# Patient Record
Sex: Male | Born: 1955 | Race: White | Hispanic: No | Marital: Married | Smoking: Current every day smoker
Health system: Southern US, Community
[De-identification: ages and names within clinical notes are randomized; demographics above are authoritative.]

## PROBLEM LIST (undated history)

## (undated) ENCOUNTER — Emergency Department (HOSPITAL_COMMUNITY): Admission: EM | Payer: Self-pay

## (undated) HISTORY — PX: ANKLE SURGERY: SHX546

## (undated) HISTORY — PX: LEG SURGERY: SHX1003

## (undated) HISTORY — PX: VASECTOMY: SHX75

---

## 2011-02-28 ENCOUNTER — Other Ambulatory Visit: Payer: Self-pay

## 2011-02-28 ENCOUNTER — Emergency Department (HOSPITAL_COMMUNITY)
Admission: EM | Admit: 2011-02-28 | Discharge: 2011-02-28 | Disposition: A | Discharge status: Home / Self Care | Payer: Self-pay | Attending: Emergency Medicine | Admitting: Emergency Medicine

## 2011-02-28 ENCOUNTER — Encounter: Payer: Self-pay | Admitting: *Deleted

## 2011-02-28 DIAGNOSIS — R42 Dizziness and giddiness: Secondary | ICD-10-CM | POA: Insufficient documentation

## 2011-02-28 DIAGNOSIS — F17200 Nicotine dependence, unspecified, uncomplicated: Secondary | ICD-10-CM | POA: Insufficient documentation

## 2011-02-28 MED ORDER — MECLIZINE HCL 25 MG PO TABS
50.0000 mg | ORAL_TABLET | Freq: Once | ORAL | Status: AC
  Administered 2011-02-28: 50 mg via ORAL
  Filled 2011-02-28 (×2): qty 1

## 2011-02-28 MED ORDER — MECLIZINE HCL 25 MG PO TABS: 25.0000 mg | ORAL_TABLET | Freq: Three times a day (TID) | ORAL | Status: AC | PRN

## 2011-02-28 NOTE — ED Provider Notes (Signed)
History     CSN: [id number] Arrival date & time: 02/28/2011  4:23 PM   First MD Initiated Contact with Patient 02/28/11 1650      Chief Complaint  Patient presents with  . Dizziness     The history is provided by the patient and the spouse.  Onset - 4 days ago Duration - intermittent Worsened by - moving to the side in bed Improved by -sitting still Associated symptoms - nausea, vomiting  Pt reports episodes of dizziness - he reports surrounding spinning.  No syncope No cp/sob//abd pain/HA/focal weakness No slurred speech.  No difficulty swallowing No fever No hearing loss/change No visual change No falls/head trauma He takes no meds He has never had this before He vomited two days, none since   PMH - none  Past Surgical History  Procedure Date  . Ankle surgery   . Leg surgery   . Vasectomy     No family history on file.  History  Substance Use Topics  . Smoking status: Current Everyday Smoker  . Smokeless tobacco: Not on file  . Alcohol Use: Yes      Review of Systems  All other systems reviewed and are negative.    Allergies  Review of patient's allergies indicates no known allergies.  Home Medications   Current Outpatient Rx  Name Route Sig Dispense Refill  . ASPIRIN 325 MG PO TABS Oral Take 325 mg by mouth daily.        BP 143/91  Pulse 64  Temp(Src) 98.3 F (36.8 C) (Oral)  Resp 19  SpO2 97%  Physical Exam CONSTITUTIONAL: Well developed/well nourished HEAD AND FACE: Normocephalic/atraumatic EYES: EOMI/PERRL, no nystagmus ENMT: Mucous membranes moist, Left TM/right TM normal NECK: supple no meningeal signs, no bruits SPINE:entire spine nontender CV: S1/S2 noted, no murmurs/rubs/gallops noted LUNGS: Lungs are clear to auscultation bilaterally, no apparent distress ABDOMEN: soft, nontender, no rebound or guarding NEURO: Awake/alert, facies symmetric, no arm or leg drift is noted Cranial nerves 3/4/5/6/10/12/08/11/12 tested and  intact Gait normal No pastpointing EXTREMITIES: pulses normal, full ROM SKIN: warm, color normal PSYCH: no abnormalities of mood noted    ED Course  Procedures   5:10 PM Pt is well appearing, watching TV, only came in at wife's insistence.   History/physical c/w peripheral vertigo I doubt acute CVA Wife is concerned about ACS - I ordered EKG but otherwise feel he does not need a cardiac workup at this time  5:56 PM Pt improved, no distress, stable for d/c  MDM  Nursing notes reviewed and considered in documentation      Date: 02/28/2011  Rate: 56  Rhythm: normal sinus rhythm  QRS Axis: normal  Intervals: normal  ST/T Wave abnormalities: normal  Conduction Disutrbances:none  Narrative Interpretation:   Old EKG Reviewed: none available      Joya Gaskins, MD 02/28/11 1756

## 2011-02-28 NOTE — ED Notes (Signed)
Pt has been having dizziness for about 5 days, states he is dizzy in his sleep and while awake, has had episodes of nausea and vomiting

## 2012-01-21 ENCOUNTER — Emergency Department (HOSPITAL_COMMUNITY)
Admission: EM | Admit: 2012-01-21 | Discharge: 2012-01-21 | Disposition: A | Discharge status: Home / Self Care | Payer: Self-pay | Attending: Dermatology | Admitting: Dermatology

## 2012-01-21 ENCOUNTER — Emergency Department (HOSPITAL_COMMUNITY): Payer: Self-pay

## 2012-01-21 ENCOUNTER — Encounter (HOSPITAL_COMMUNITY): Payer: Self-pay | Admitting: *Deleted

## 2012-01-21 DIAGNOSIS — L0390 Cellulitis, unspecified: Secondary | ICD-10-CM

## 2012-01-21 DIAGNOSIS — Z7982 Long term (current) use of aspirin: Secondary | ICD-10-CM | POA: Insufficient documentation

## 2012-01-21 DIAGNOSIS — K047 Periapical abscess without sinus: Secondary | ICD-10-CM | POA: Insufficient documentation

## 2012-01-21 LAB — POCT I-STAT, CHEM 8
BUN: 9 mg/dL (ref 6–23)
Calcium, Ion: 1.14 mmol/L (ref 1.12–1.23)
HCT: 45 % (ref 39.0–52.0)
Hemoglobin: 15.3 g/dL (ref 13.0–17.0)
Sodium: 139 mEq/L (ref 135–145)
TCO2: 22 mmol/L (ref 0–100)

## 2012-01-21 LAB — CBC
MCH: 29.9 pg (ref 26.0–34.0)
Platelets: 401 10*3/uL — ABNORMAL HIGH (ref 150–400)
RBC: 4.79 MIL/uL (ref 4.22–5.81)
RDW: 14.6 % (ref 11.5–15.5)

## 2012-01-21 MED ORDER — CLINDAMYCIN PHOSPHATE 900 MG/50ML IV SOLN
900.0000 mg | Freq: Once | INTRAVENOUS | Status: AC
  Administered 2012-01-21: 900 mg via INTRAVENOUS
  Filled 2012-01-21: qty 50

## 2012-01-21 MED ORDER — PENICILLIN V POTASSIUM 500 MG PO TABS: 500.0000 mg | ORAL_TABLET | Freq: Three times a day (TID) | ORAL | Status: AC

## 2012-01-21 MED ORDER — IOHEXOL 300 MG/ML  SOLN: 100.0000 mL | Freq: Once | INTRAMUSCULAR | Status: DC | PRN

## 2012-01-21 MED ORDER — SODIUM CHLORIDE 0.9 % IV SOLN
Freq: Once | INTRAVENOUS | Status: AC
  Administered 2012-01-21: 1000 mL via INTRAVENOUS

## 2012-01-21 MED ORDER — IOHEXOL 300 MG/ML  SOLN
100.0000 mL | Freq: Once | INTRAMUSCULAR | Status: AC | PRN
  Administered 2012-01-21: 100 mL via INTRAVENOUS

## 2012-01-21 MED ORDER — HYDROCODONE-ACETAMINOPHEN 5-325 MG PO TABS
2.0000 | ORAL_TABLET | ORAL | Status: AC | PRN
Start: 2012-01-21 — End: ?

## 2012-01-21 NOTE — ED Provider Notes (Signed)
History     CSN: [id number]  Arrival date & time 01/21/12  1222   First MD Initiated Contact with Patient 01/21/12 1313      Chief Complaint  Patient presents with  . Facial Swelling    (Consider location/radiation/quality/duration/timing/severity/associated sxs/prior treatment) HPI Comments: Patient state yesterday noticed a small bump beside his nose on the left yesterday, when he woke this morning.  His entire left face was swollen, including periorbital denies any pain with movement of the eye, headache.   The history is provided by the patient.    History reviewed. No pertinent past medical history.  Past Surgical History  Procedure Date  . Ankle surgery   . Leg surgery   . Vasectomy     No family history on file.  History  Substance Use Topics  . Smoking status: Current Every Day Smoker  . Smokeless tobacco: Not on file  . Alcohol Use: Yes      Review of Systems  Constitutional: Negative for fever and chills.  HENT: Negative for ear pain, congestion, rhinorrhea, dental problem and sinus pressure.   Eyes: Negative for photophobia, pain and visual disturbance.  Respiratory: Negative for shortness of breath.   Cardiovascular: Negative for chest pain.  Gastrointestinal: Negative for nausea and vomiting.  Skin: Negative for rash and wound.  Neurological: Negative for dizziness, weakness and headaches.    Allergies  Review of patient's allergies indicates no known allergies.  Home Medications   Current Outpatient Rx  Name Route Sig Dispense Refill  . ASPIRIN 325 MG PO TABS Oral Take 325 mg by mouth daily.      Marland Kitchen HYDROCODONE-ACETAMINOPHEN 5-325 MG PO TABS Oral Take 2 tablets by mouth every 4 (four) hours as needed for pain. 10 tablet 0  . PENICILLIN V POTASSIUM 500 MG PO TABS Oral Take 1 tablet (500 mg total) by mouth 3 (three) times daily. 30 tablet 0    BP 145/90  Pulse 73  Temp 98.3 F (36.8 C) (Oral)  Resp 20  SpO2 97%  Physical Exam    Constitutional: He is oriented to person, place, and time. He appears well-developed and well-nourished.  HENT:  Head: Normocephalic.         Very poor dentition with many missing teeth and dental decay. Area of erythema and swelling outlined  Eyes: Conjunctivae normal and EOM are normal. Pupils are equal, round, and reactive to light.       No pain with movement of the eyes  Neck: Normal range of motion.  Pulmonary/Chest: Effort normal and breath sounds normal.  Musculoskeletal: Normal range of motion.  Neurological: He is alert and oriented to person, place, and time.  Skin: Skin is warm.    ED Course  Procedures (including critical care time)  Labs Reviewed  CBC - Abnormal; Notable for the following:    WBC 13.6 (*)     Platelets 401 (*)     All other components within normal limits  POCT I-STAT, CHEM 8   Ct Maxillofacial W/cm  01/21/2012  *RADIOLOGY REPORT*  Clinical Data: Left facial swelling.  CT MAXILLOFACIAL WITH CONTRAST  Technique:  Multidetector CT imaging of the maxillofacial structures was performed with intravenous contrast. Multiplanar CT image reconstructions were also generated.  Contrast: OMNIPAQUE IOHEXOL 300 MG/ML  SOLN  Comparison: None.  Findings: Soft tissue swelling is seen on the left side of the face, predominantly anterior to the maxilla.  No discrete fluid collection to suggest soft tissue abscess is identified.  No post- septal swelling is seen in either orbit.  The patient has bilateral maxillary sinus disease, worse on the left.  Scattered ethmoid air cell disease is also noted.  The patient is nearly edentulous. There is some peri apical lucency about the left frontal incisor. The globes are intact and the lenses are located.  Visualized vascular structures are patent.  Imaged intracranial contents are unremarkable.  IMPRESSION:  1.  Soft tissue swelling about the left side of the face compatible with cellulitis.  No soft tissue abscess is  identified. Periapical lucency about the left frontal incisor is worrisome for abscess. 2.  Left worse than right maxillary sinus disease.  Scattered ethmoid air cell opacity also noted.   Original Report Authenticated By: Bernadene Bell. D'ALESSIO, M.D.      1. Dental abscess   2. Cellulitis       MDM   Will obtain CT of face, including the orbits to rule out her orbital cellulitis but this is most likely a dental abscess, with extension into the orbital area Patient.  Buccal abscess.  Discuss antibiotic purchase a BP at a pocket for the, medications we'll switch to penicillin with a dental referral with the understanding that if the swelling.  Gets worse or if he becomes worse in any way they're to return to the emergency department        Arman Filter, NP 01/21/12 1542

## 2012-01-21 NOTE — ED Notes (Signed)
Patient transported to X-ray 

## 2012-01-21 NOTE — ED Notes (Signed)
Pt reports L facial swelling which he noticed this am.  Pt reports noticing a "bump" yesterday right in the L side corner of his nose.  ?insect bite.  Pt reports warmth to touch and pain.

## 2012-01-25 NOTE — ED Provider Notes (Signed)
Medical screening examination/treatment/procedure(s) were performed by non-physician practitioner and as supervising physician I was immediately available for consultation/collaboration.   Suzi Roots, MD 01/25/12 [phone]

## 2013-07-19 IMAGING — CT CT MAXILLOFACIAL W/ CM
1 series · 15 of 30 positions shown, 19 images · IV contrast (OMNIPAQUE 300)
Comparison: None.

CLINICAL DATA: Left facial swelling.

CT MAXILLOFACIAL WITH CONTRAST
TECHNIQUE: Multidetector CT imaging of the maxillofacial
structures was performed with intravenous contrast. Multiplanar CT
image reconstructions were also generated.
Contrast: 100mL OMNIPAQUE IOHEXOL 300 MG/ML  SOLN

[Series 2: facial st · axial · 0.29mm/px · z∈[-239,-107]mm · 15 of 72 slices shown, 19 images]
[im 3/72  brain]
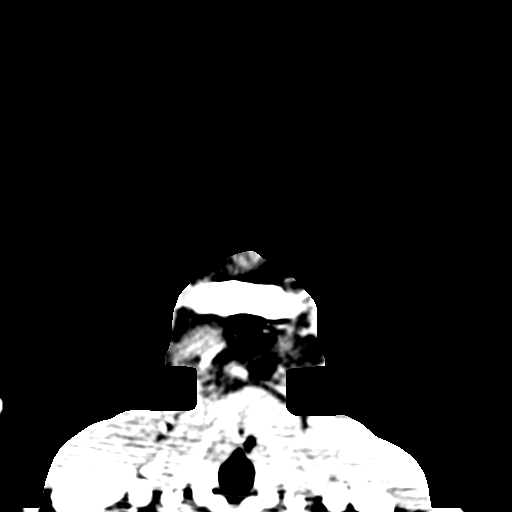
[im 3/72  bone]
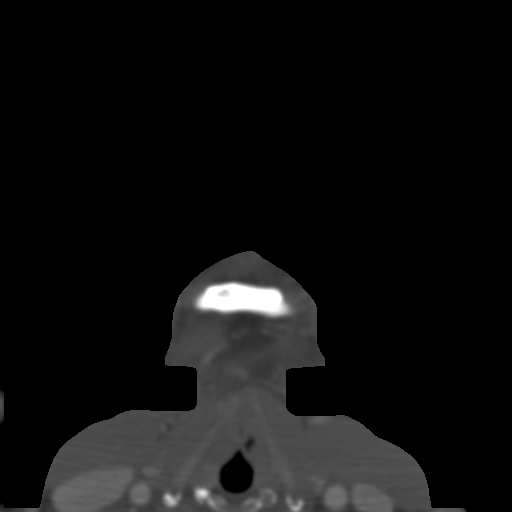
[im 8/72  bone]
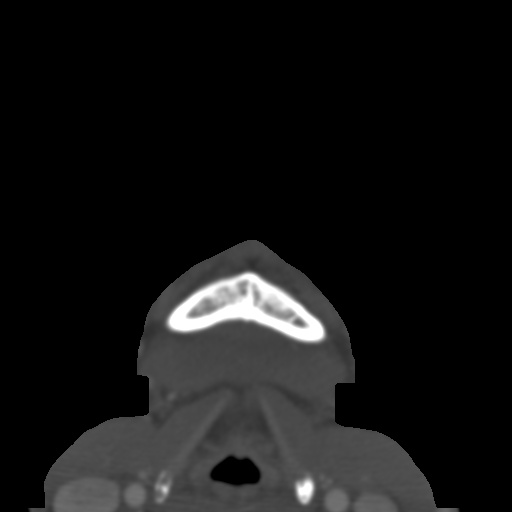
[im 13/72  bone]
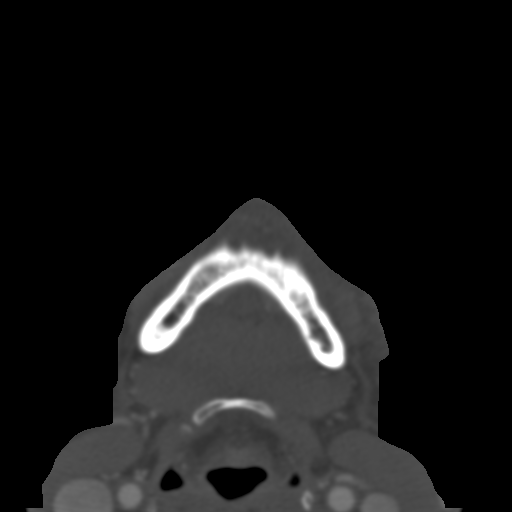
[im 18/72  bone]
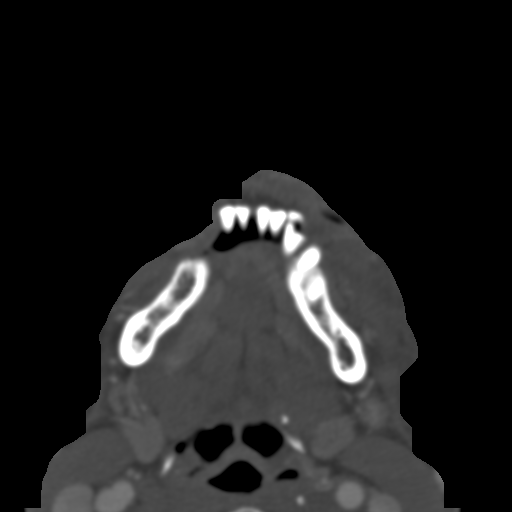
[im 23/72  brain]
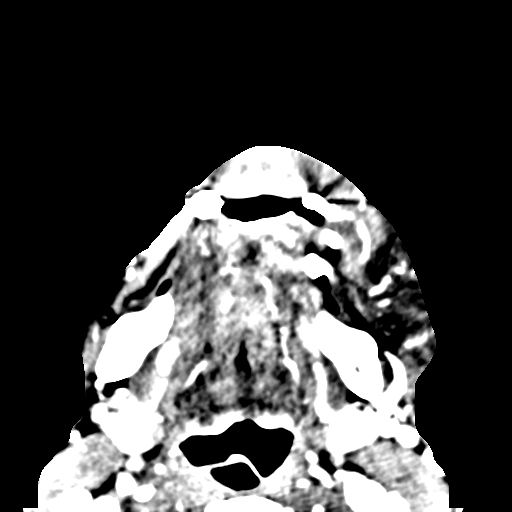
[im 23/72  bone]
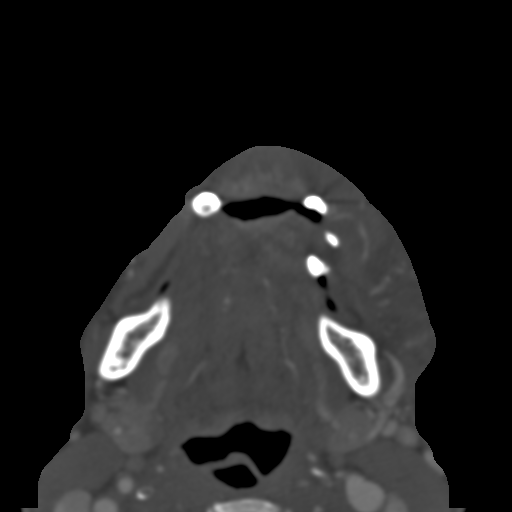
[im 27/72  bone]
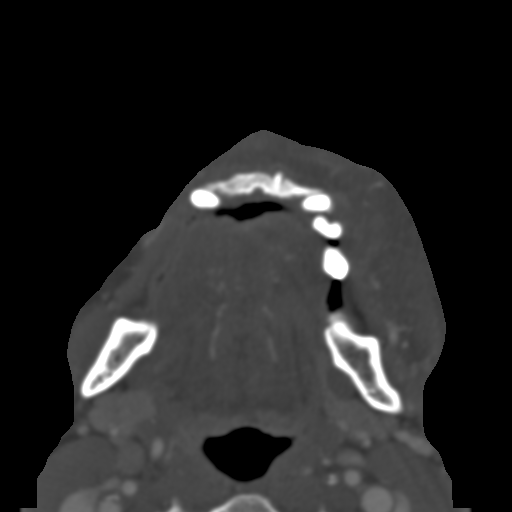
[im 32/72  bone]
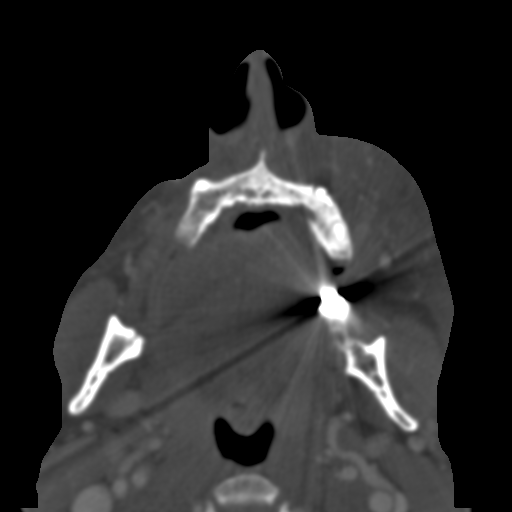
[im 37/72  bone]
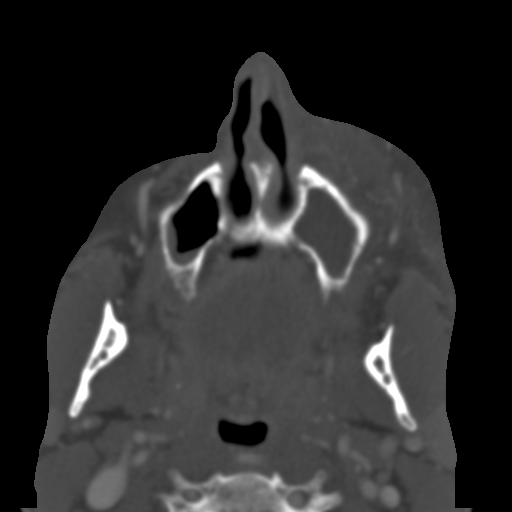
[im 40/72  brain]
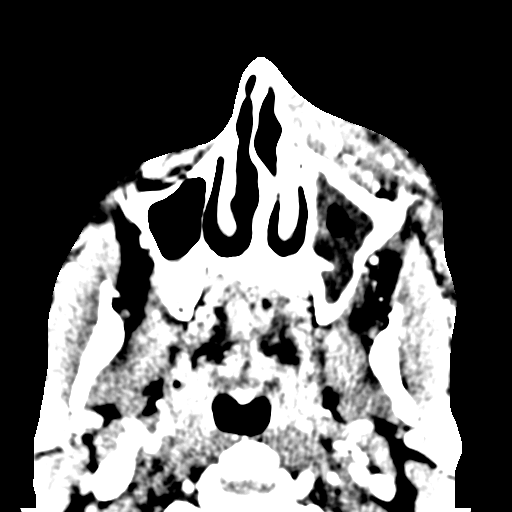
[im 40/72  bone]
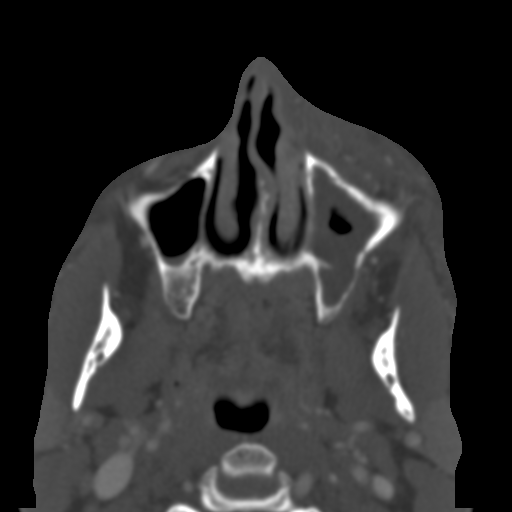
[im 45/72  bone]
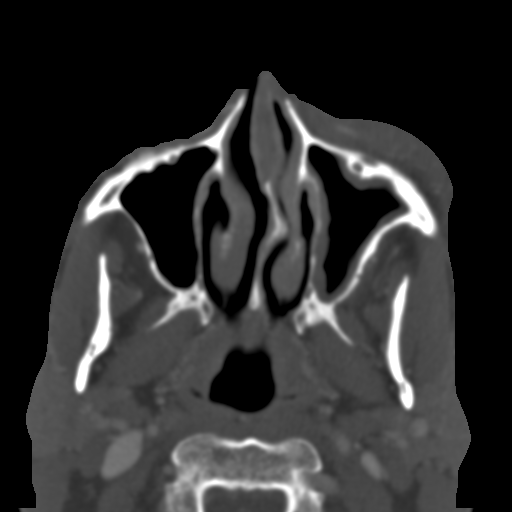
[im 49/72  bone]
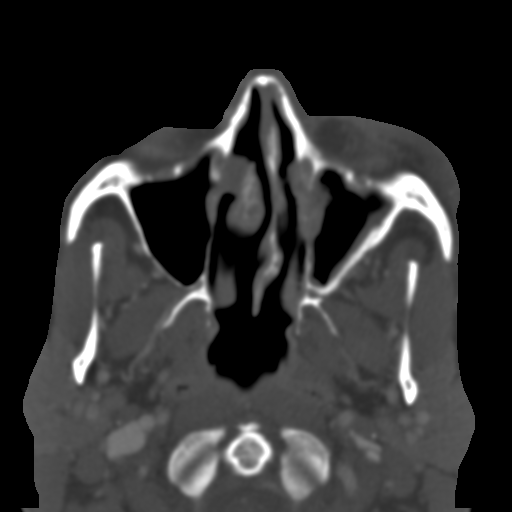
[im 54/72  bone]
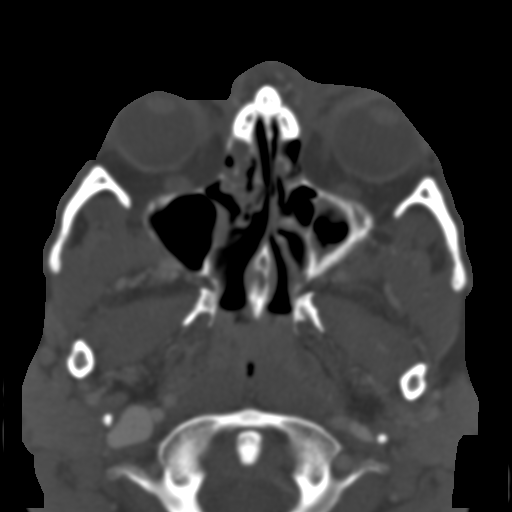
[im 59/72  brain]
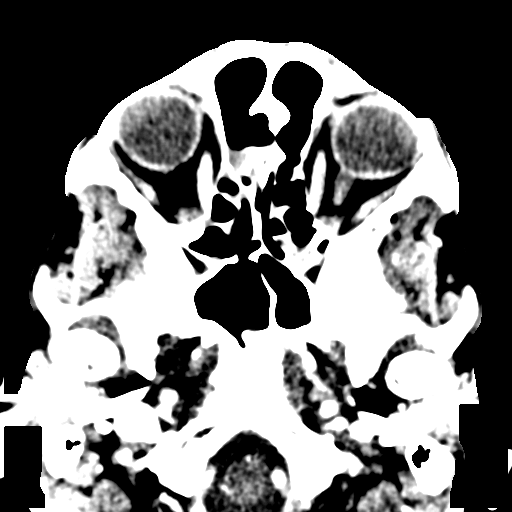
[im 59/72  bone]
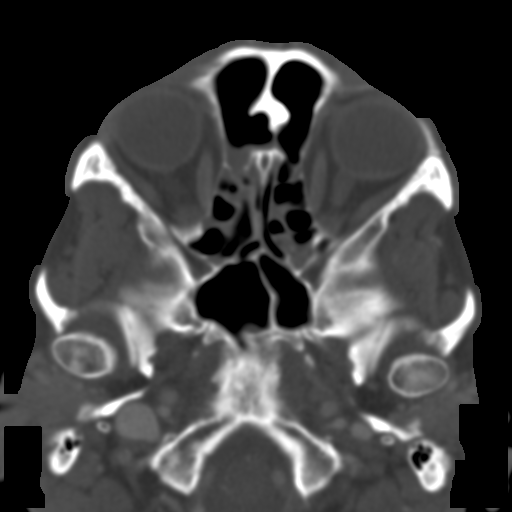
[im 64/72  bone]
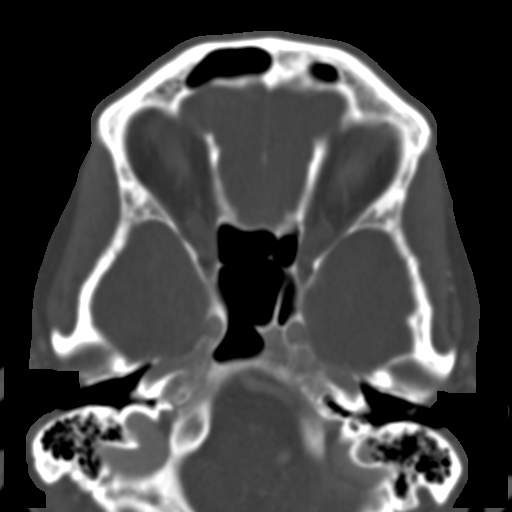
[im 69/72  bone]
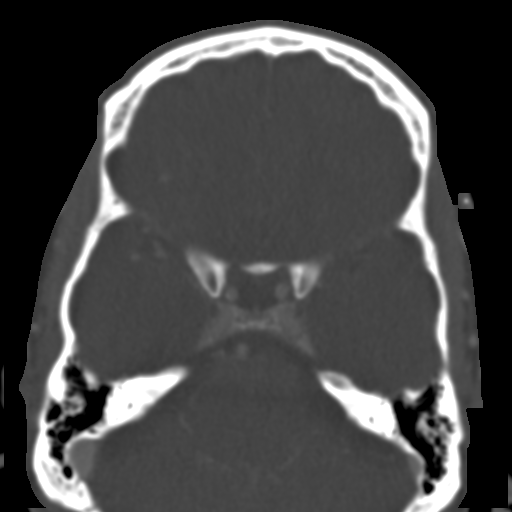

[15 of 30 positions shown; findings below may reference images not displayed]

FINDINGS: Soft tissue swelling is seen on the left side of the
face, predominantly anterior to the maxilla.  No discrete fluid
collection to suggest soft tissue abscess is identified.  No post-
septal swelling is seen in either orbit.  The patient has bilateral
maxillary sinus disease, worse on the left.  Scattered ethmoid air
cell disease is also noted.  The patient is nearly edentulous.
There is some peri apical lucency about the left frontal incisor.
The globes are intact and the lenses are located.  Visualized
vascular structures are patent.  Imaged intracranial contents are
unremarkable.
IMPRESSION: 1.  Soft tissue swelling about the left side of the face compatible
with cellulitis.  No soft tissue abscess is identified. Periapical
lucency about the left frontal incisor is worrisome for abscess.
2.  Left worse than right maxillary sinus disease.  Scattered
ethmoid air cell opacity also noted.

## 2018-03-12 ENCOUNTER — Emergency Department (HOSPITAL_BASED_OUTPATIENT_CLINIC_OR_DEPARTMENT_OTHER): Payer: Self-pay

## 2018-03-12 ENCOUNTER — Encounter (HOSPITAL_BASED_OUTPATIENT_CLINIC_OR_DEPARTMENT_OTHER): Payer: Self-pay | Admitting: Adult Health

## 2018-03-12 ENCOUNTER — Emergency Department (HOSPITAL_BASED_OUTPATIENT_CLINIC_OR_DEPARTMENT_OTHER)
Admission: EM | Admit: 2018-03-12 | Discharge: 2018-03-12 | Disposition: A | Discharge status: Home / Self Care | Payer: Self-pay | Attending: Emergency Medicine | Admitting: Emergency Medicine

## 2018-03-12 ENCOUNTER — Other Ambulatory Visit: Payer: Self-pay

## 2018-03-12 DIAGNOSIS — Y9389 Activity, other specified: Secondary | ICD-10-CM | POA: Insufficient documentation

## 2018-03-12 DIAGNOSIS — Y998 Other external cause status: Secondary | ICD-10-CM | POA: Insufficient documentation

## 2018-03-12 DIAGNOSIS — S60222A Contusion of left hand, initial encounter: Secondary | ICD-10-CM | POA: Insufficient documentation

## 2018-03-12 DIAGNOSIS — Y929 Unspecified place or not applicable: Secondary | ICD-10-CM | POA: Insufficient documentation

## 2018-03-12 DIAGNOSIS — Z7982 Long term (current) use of aspirin: Secondary | ICD-10-CM | POA: Insufficient documentation

## 2018-03-12 DIAGNOSIS — F17200 Nicotine dependence, unspecified, uncomplicated: Secondary | ICD-10-CM | POA: Insufficient documentation

## 2018-03-12 DIAGNOSIS — W208XXA Other cause of strike by thrown, projected or falling object, initial encounter: Secondary | ICD-10-CM | POA: Insufficient documentation

## 2018-03-12 MED ORDER — ACETAMINOPHEN 500 MG PO TABS: 500.0000 mg | ORAL_TABLET | Freq: Four times a day (QID) | ORAL | 0 refills | Status: AC | PRN

## 2018-03-12 MED ORDER — IBUPROFEN 600 MG PO TABS: 600.0000 mg | ORAL_TABLET | Freq: Four times a day (QID) | ORAL | 0 refills | Status: AC | PRN

## 2018-03-12 MED ORDER — TETANUS-DIPHTH-ACELL PERTUSSIS 5-2.5-18.5 LF-MCG/0.5 IM SUSP: 0.5000 mL | Freq: Once | INTRAMUSCULAR | Status: DC

## 2018-03-12 NOTE — ED Triage Notes (Addendum)
PResents with Left hand swelling that began Thursday after a chair dropped on his hand that he was helping move.

## 2018-03-12 NOTE — ED Notes (Addendum)
Refuses to wait for splint to be applied. Cursing at nurse about having to wait. ED PA in to see

## 2018-03-12 NOTE — Discharge Instructions (Signed)
Use ice 3-4 times daily alternating 20 minutes on, 20 minutes off.  You can alternate with ibuprofen and Tylenol as needed for your pain.  Try to avoid using your hand believe this is contributing to the swelling.  Please understand that by declining your tetanus shot, you could contract tetanus which is very difficult to treat.  Please return the emergency department he develop any new or worsening symptoms.

## 2018-03-12 NOTE — ED Notes (Signed)
ED Provider at bedside. 

## 2018-03-12 NOTE — ED Notes (Signed)
EDP made aware that patient declined Tdap.

## 2018-03-12 NOTE — ED Provider Notes (Signed)
[facility] EMERGENCY DEPARTMENT Provider Note   CSN: [id number] Arrival date & time: 03/12/18  1044     History   Chief Complaint Chief Complaint  Patient presents with  . Hand Injury    HPI Paul Wilson is a 62 y.o. male who is previously healthy who presents with left hand pain that began 3 days ago on a piece of furniture dropped on it.  There is immediate swelling.  Patient has had continued pain to the area with radiation of his arm.  He has had continued swelling.  There was a small cut on top which is healing.  His tetanus is not up-to-date.  He denies any other injuries.  He has had some associated tingling in his fingers.  He has had limited range of motion of his hand.  He denies any wrist pain.  HPI  History reviewed. No pertinent past medical history.  There are no active problems to display for this patient.   Past Surgical History:  Procedure Laterality Date  . ANKLE SURGERY    . LEG SURGERY    . VASECTOMY          Home Medications    Prior to Admission medications   Medication Sig Start Date End Date Taking? Authorizing Provider  aspirin 325 MG tablet Take 325 mg by mouth daily.     Yes [provider]  acetaminophen (TYLENOL) 500 MG tablet Take 1 tablet (500 mg total) by mouth every 6 (six) hours as needed. 03/12/18   Tanis Burnley, Waylan BogaAlexandra M, PA-C  HYDROcodone-acetaminophen (NORCO/VICODIN) 5-325 MG per tablet Take 2 tablets by mouth every 4 (four) hours as needed for pain. 01/21/12   Earley FavorSchulz, Gail, NP  ibuprofen (ADVIL,MOTRIN) 600 MG tablet Take 1 tablet (600 mg total) by mouth every 6 (six) hours as needed. 03/12/18   Arietta Eisenstein, Waylan BogaAlexandra M, PA-C  penicillin v potassium (VEETID) 500 MG tablet Take 1 tablet (500 mg total) by mouth 3 (three) times daily. 01/21/12   Earley FavorSchulz, Gail, NP    Family History History reviewed. No pertinent family history.  Social History Social History   Tobacco Use  . Smoking status: Current Every Day Smoker  Substance  Use Topics  . Alcohol use: Yes  . Drug use: No     Allergies   Patient has no known allergies.   Review of Systems Review of Systems  Constitutional: Negative for fever.  Musculoskeletal: Positive for arthralgias and joint swelling.  Neurological: Positive for numbness (paresthesia).     Physical Exam Updated Vital Signs BP (!) 162/145 (BP Location: Right Arm)   Pulse 64   Temp 98.2 F (36.8 C) (Oral)   Resp 18   Ht 5\' 5"  (1.651 m)   Wt 73 kg   SpO2 97%   BMI 26.79 kg/m   Physical Exam  Constitutional: He appears well-developed and well-nourished. No distress.  HENT:  Head: Normocephalic and atraumatic.  Mouth/Throat: Oropharynx is clear and moist. No oropharyngeal exudate.  Eyes: Pupils are equal, round, and reactive to light. Conjunctivae are normal. Right eye exhibits no discharge. Left eye exhibits no discharge. No scleral icterus.  Neck: Normal range of motion. Neck supple. No thyromegaly present.  Cardiovascular: Normal rate, regular rhythm, normal heart sounds and intact distal pulses. Exam reveals no gallop and no friction rub.  No murmur heard. Pulmonary/Chest: Effort normal and breath sounds normal. No stridor. No respiratory distress. He has no wheezes. He has no rales.  Abdominal: Soft. Bowel sounds are normal. He  exhibits no distension. There is no tenderness. There is no rebound and no guarding.  Musculoskeletal: He exhibits no edema.  L hand: Significant edema noted to the hand, most fully localized to the second and third MCP, scabbed abrasion over same; significant tenderness in this area, but extending through all the metacarpals, range of motion is intact, but limited secondary to swelling, mild warmth, but no erythema, sensation intact to the digits, cap refill less than 2 seconds, radial pulses intact, no wrist or anatomical snuffbox tenderness  Lymphadenopathy:    He has no cervical adenopathy.  Neurological: He is alert. Coordination normal.    Skin: Skin is warm and dry. No rash noted. He is not diaphoretic. No pallor.  Psychiatric: He has a normal mood and affect.  Nursing note and vitals reviewed.    ED Treatments / Results  Labs (all labs ordered are listed, but only abnormal results are displayed) Labs Reviewed - No data to display  EKG None  Radiology Dg Hand Complete Left  Result Date: 03/12/2018 CLINICAL DATA:  62 year old male with pain and redness at the second MCP joint. Patient smashed his hand against a wooden chair. EXAM: LEFT HAND - COMPLETE 3+ VIEW COMPARISON:  None. FINDINGS: No evidence of acute fracture or malalignment. Remote healed fracture of the fourth metacarpal. Abnormality of the distal phalanx of the fourth finger suggests prior partial amputation through the distal tuft. 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 Additionally, there are mild degenerative changes at the STT joint. Diffuse soft tissue swelling is present overlying the dorsal aspect of the hand. IMPRESSION: 1. Diffuse soft tissue swelling without evidence of underlying fracture or malalignment. 2. Remote healed right fourth metacarpal fracture. 3. 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 4. 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 Electronically Signed   By: Malachy Moan M.D.   On: 03/12/2018 11:49    Procedures Procedures (including critical care time)  Medications Ordered in ED Medications - No data to display   Initial Impression / Assessment and Plan / ED Course  I have reviewed the triage vital signs and the nursing notes.  Pertinent labs & imaging results that were available during my care of the patient were reviewed by me and considered in my medical decision making (see chart for details).  Clinical Course as of Mar 12 1840  Sat Mar 12, 2018  2856 62 year old male with crush injury to his left hand on Thursday.  He has been swollen and painful  since then.  He continues to use his hand and does not feel like it is improved at all.  Associated with some electrical pain sensation into his digits.  X-rays are negative.  I think this is mostly hematoma and is probably overdoing it needs to be splinted and allow it to heal.   [MB]    Clinical Course User Index [MB] Terrilee Files, MD    Patient's with left hand swelling after he smashed his hand against a wooden chair.  X-rays negative for acute findings, but does show diffuse soft tissue swelling.  Suspect hematoma.  I recommended splint, ice, Tylenol, ibuprofen.  Patient did not want to wait for the splint.  Patient blood pressure elevated, although he was angry from waiting for the splint when he left.  Patient given strict return precautions.  He understands and agrees with plan.  Patient discharged in satisfactory condition.  Patient also evaluated by my attending, Dr.  Charm Barges, who guided the patient's management and agrees with plan.  Final Clinical Impressions(s) / ED Diagnoses   Final diagnoses:  Contusion of left hand, initial encounter    ED Discharge Orders         Ordered    ibuprofen (ADVIL,MOTRIN) 600 MG tablet  Every 6 hours PRN     03/12/18 1257    acetaminophen (TYLENOL) 500 MG tablet  Every 6 hours PRN     03/12/18 1257           Ravenna Legore, [place], PA-C 03/12/18 1841    Terrilee Files, MD 03/13/18 [phone]
